# Patient Record
Sex: Female | Born: 1960 | Race: White | Hispanic: No
Health system: Southern US, Community
[De-identification: ages and names within clinical notes are randomized; demographics above are authoritative.]

---

## 1998-01-30 ENCOUNTER — Ambulatory Visit (HOSPITAL_COMMUNITY): Admission: RE | Admit: 1998-01-30 | Discharge: 1998-01-30 | Payer: Self-pay | Admitting: Obstetrics and Gynecology

## 1999-07-11 ENCOUNTER — Observation Stay (HOSPITAL_COMMUNITY): Admission: AD | Admit: 1999-07-11 | Discharge: 1999-07-12 | Payer: Self-pay | Admitting: *Deleted

## 2000-04-11 ENCOUNTER — Other Ambulatory Visit: Admission: RE | Admit: 2000-04-11 | Discharge: 2000-04-11 | Payer: Self-pay | Admitting: Obstetrics and Gynecology

## 2000-05-03 ENCOUNTER — Inpatient Hospital Stay (HOSPITAL_COMMUNITY): Admission: AD | Admit: 2000-05-03 | Discharge: 2000-05-03 | Payer: Self-pay | Admitting: Obstetrics and Gynecology

## 2001-03-01 ENCOUNTER — Ambulatory Visit (HOSPITAL_COMMUNITY): Admission: RE | Admit: 2001-03-01 | Discharge: 2001-03-01 | Payer: Self-pay | Admitting: Obstetrics and Gynecology

## 2001-03-20 ENCOUNTER — Other Ambulatory Visit: Admission: RE | Admit: 2001-03-20 | Discharge: 2001-03-20 | Payer: Self-pay | Admitting: Obstetrics and Gynecology

## 2002-05-14 ENCOUNTER — Other Ambulatory Visit: Admission: RE | Admit: 2002-05-14 | Discharge: 2002-05-14 | Payer: Self-pay | Admitting: Obstetrics and Gynecology

## 2003-07-17 ENCOUNTER — Other Ambulatory Visit: Admission: RE | Admit: 2003-07-17 | Discharge: 2003-07-17 | Payer: Self-pay | Admitting: Obstetrics and Gynecology

## 2004-08-10 ENCOUNTER — Ambulatory Visit: Payer: Self-pay | Admitting: Internal Medicine

## 2004-08-28 ENCOUNTER — Other Ambulatory Visit: Admission: RE | Admit: 2004-08-28 | Discharge: 2004-08-28 | Payer: Self-pay | Admitting: Obstetrics and Gynecology

## 2005-10-11 ENCOUNTER — Other Ambulatory Visit: Admission: RE | Admit: 2005-10-11 | Discharge: 2005-10-11 | Payer: Self-pay | Admitting: Obstetrics and Gynecology

## 2010-12-15 ENCOUNTER — Other Ambulatory Visit: Payer: Self-pay | Admitting: Obstetrics and Gynecology

## 2010-12-15 DIAGNOSIS — R928 Other abnormal and inconclusive findings on diagnostic imaging of breast: Secondary | ICD-10-CM

## 2010-12-18 ENCOUNTER — Ambulatory Visit
Admission: RE | Admit: 2010-12-18 | Discharge: 2010-12-18 | Disposition: A | Discharge status: Home / Self Care | Payer: BC Managed Care – PPO | Source: Ambulatory Visit | Attending: Obstetrics and Gynecology | Admitting: Obstetrics and Gynecology

## 2010-12-18 DIAGNOSIS — R928 Other abnormal and inconclusive findings on diagnostic imaging of breast: Secondary | ICD-10-CM

## 2016-10-14 ENCOUNTER — Other Ambulatory Visit: Payer: Self-pay | Admitting: Otolaryngology

## 2016-10-14 DIAGNOSIS — H9120 Sudden idiopathic hearing loss, unspecified ear: Secondary | ICD-10-CM

## 2016-10-20 ENCOUNTER — Ambulatory Visit
Admission: RE | Admit: 2016-10-20 | Discharge: 2016-10-20 | Disposition: A | Discharge status: Home / Self Care | Payer: BC Managed Care – PPO | Source: Ambulatory Visit | Attending: Otolaryngology | Admitting: Otolaryngology

## 2016-10-20 DIAGNOSIS — H9120 Sudden idiopathic hearing loss, unspecified ear: Secondary | ICD-10-CM

## 2016-10-25 ENCOUNTER — Other Ambulatory Visit: Payer: Self-pay | Admitting: Otolaryngology

## 2016-10-25 DIAGNOSIS — H9120 Sudden idiopathic hearing loss, unspecified ear: Secondary | ICD-10-CM

## 2016-10-26 ENCOUNTER — Ambulatory Visit
Admission: RE | Admit: 2016-10-26 | Discharge: 2016-10-26 | Disposition: A | Discharge status: Home / Self Care | Payer: BC Managed Care – PPO | Source: Ambulatory Visit | Attending: Otolaryngology | Admitting: Otolaryngology

## 2016-10-26 DIAGNOSIS — H9120 Sudden idiopathic hearing loss, unspecified ear: Secondary | ICD-10-CM

## 2016-10-26 MED ORDER — IOPAMIDOL (ISOVUE-300) INJECTION 61%
75.0000 mL | Freq: Once | INTRAVENOUS | Status: AC | PRN
  Administered 2016-10-26: 75 mL via INTRAVENOUS

## 2017-06-17 ENCOUNTER — Other Ambulatory Visit: Payer: Self-pay | Admitting: Otolaryngology

## 2017-06-17 DIAGNOSIS — H9042 Sensorineural hearing loss, unilateral, left ear, with unrestricted hearing on the contralateral side: Secondary | ICD-10-CM

## 2017-06-17 DIAGNOSIS — IMO0001 Reserved for inherently not codable concepts without codable children: Secondary | ICD-10-CM

## 2017-06-26 ENCOUNTER — Ambulatory Visit
Admission: RE | Admit: 2017-06-26 | Discharge: 2017-06-26 | Disposition: A | Discharge status: Home / Self Care | Payer: BC Managed Care – PPO | Source: Ambulatory Visit | Attending: Otolaryngology | Admitting: Otolaryngology

## 2017-06-26 DIAGNOSIS — H9042 Sensorineural hearing loss, unilateral, left ear, with unrestricted hearing on the contralateral side: Secondary | ICD-10-CM

## 2017-06-26 DIAGNOSIS — IMO0001 Reserved for inherently not codable concepts without codable children: Secondary | ICD-10-CM

## 2018-01-10 ENCOUNTER — Other Ambulatory Visit: Payer: Self-pay | Admitting: Otolaryngology

## 2018-01-10 DIAGNOSIS — H8302 Labyrinthitis, left ear: Secondary | ICD-10-CM

## 2018-01-10 DIAGNOSIS — IMO0001 Reserved for inherently not codable concepts without codable children: Secondary | ICD-10-CM

## 2018-01-10 DIAGNOSIS — H9042 Sensorineural hearing loss, unilateral, left ear, with unrestricted hearing on the contralateral side: Secondary | ICD-10-CM

## 2018-01-17 ENCOUNTER — Ambulatory Visit
Admission: RE | Admit: 2018-01-17 | Discharge: 2018-01-17 | Disposition: A | Discharge status: Home / Self Care | Payer: BC Managed Care – PPO | Source: Ambulatory Visit | Attending: Otolaryngology | Admitting: Otolaryngology

## 2018-01-17 DIAGNOSIS — IMO0001 Reserved for inherently not codable concepts without codable children: Secondary | ICD-10-CM

## 2018-01-17 DIAGNOSIS — H9042 Sensorineural hearing loss, unilateral, left ear, with unrestricted hearing on the contralateral side: Secondary | ICD-10-CM

## 2018-01-17 DIAGNOSIS — H8302 Labyrinthitis, left ear: Secondary | ICD-10-CM

## 2018-01-17 MED ORDER — GADOBENATE DIMEGLUMINE 529 MG/ML IV SOLN
15.0000 mL | Freq: Once | INTRAVENOUS | Status: AC | PRN
Start: 2018-01-17 — End: 2018-01-17
  Administered 2018-01-17: 15 mL via INTRAVENOUS

## 2018-01-23 ENCOUNTER — Other Ambulatory Visit: Payer: BC Managed Care – PPO

## 2018-03-13 ENCOUNTER — Ambulatory Visit
Admission: RE | Admit: 2018-03-13 | Discharge: 2018-03-13 | Disposition: A | Discharge status: Home / Self Care | Payer: BC Managed Care – PPO | Source: Ambulatory Visit | Attending: Sports Medicine | Admitting: Sports Medicine

## 2018-03-13 ENCOUNTER — Ambulatory Visit: Payer: BC Managed Care – PPO | Admitting: Sports Medicine

## 2018-03-13 VITALS — BP 134/70 | Ht 62.0 in | Wt 160.0 lb

## 2018-03-13 DIAGNOSIS — M79671 Pain in right foot: Secondary | ICD-10-CM

## 2018-03-14 NOTE — Progress Notes (Signed)
   Subjective:    Patient ID: Jodi Hall, female    DOB: 04/21/1961, 57 y.o.   MRN: [id number]  HPI chief complaint: Right foot pain  57 year old female comes in today complaining of 3-4 weeks of right foot pain. No injury that she can recall but a gradual onset of pain that is primarily on the dorsum of her foot near the second and third metatarsals. Pain is worse with walking, especially with bending her toes. She is starting to walk with a slight limp. She had a similar episode several years ago which resolved spontaneously without any specific treatment. That pain started after she attended a boot camp but she cannot recall any specific injury or increase in activity leading up to her current pain. She denies numbness or tingling in her toes. She denies pain on the bottom of her foot. No pain at the heel. Pain is described as achy and is worse at night.  Past medical history reviewed Medications reviewed Allergies reviewed    Review of Systems As above    Objective:   Physical Exam  Well-developed, well-nourished. No acute distress. Awake alert and oriented 3. Vital signs reviewed  Right foot: There may be some minimal swelling across the dorsum of the foot. She is tender to palpation at the distal third metatarsal. Mild pain with metatarsal squeeze. She has no tenderness to palpation over the metatarsal heads.No palpable neuroma. Negative mudler sign. Good pulses. Neurovascularly intact distally. Walking with a slight limp.  Bedside ultrasound of the right foot shows a mild amount of edema along the distal third metatarsal shaft but no cortical irregularity and no increase in neovascularity  X-ray of the right foot including AP, lateral, and oblique views are unremarkable. Specifically, no evidence of stress fracture      Assessment & Plan:   Right foot pain likely secondary to third metatarsal stress reaction  Although the patient does not recall any specific increase in  activity leading up to her pain her physical exam is suggestive of a distal third metatarsal stress reaction. I do not see a stress fracture on ultrasound nor on x-ray. I'm going to treat her with a postop shoe for the next 2-3 weeks. She may wean from this as her symptoms allow thereafter. If symptoms persist then consider merits of further diagnostic imaging. Follow-up for ongoing or recalcitrant issues.

## 2018-05-22 ENCOUNTER — Ambulatory Visit (INDEPENDENT_AMBULATORY_CARE_PROVIDER_SITE_OTHER): Payer: BC Managed Care – PPO | Admitting: Sports Medicine

## 2018-05-22 ENCOUNTER — Encounter: Payer: Self-pay | Admitting: Sports Medicine

## 2018-05-22 VITALS — BP 112/74 | Ht 62.0 in | Wt 160.0 lb

## 2018-05-22 DIAGNOSIS — M79671 Pain in right foot: Secondary | ICD-10-CM | POA: Diagnosis not present

## 2018-05-22 MED ORDER — MELOXICAM 15 MG PO TABS: ORAL_TABLET | ORAL | 0 refills | Status: DC

## 2018-05-22 NOTE — Progress Notes (Signed)
   Subjective:    Patient ID: Jodi Hall, female    DOB: 1961/07/25, 57 y.o.   MRN: [id number]  HPI 56yoF presents for evaluation of right foot pain. Pain located along medial and dorsal aspect of first metatarsal shaft of right foot. Onset was 3-4wks ago. No hx of trauma or injury to site. Denies any skin changes: swelling, bruising, or erythema.  Patient notices pain mostly after exercise. In terms of exercise, she is participating in body combat classes: punching and kicking exercises. She denies pain with walking; however, does have pain after exercise, prolonged standing, and at bedtime.  She describes pain as "tooth ache" that is 7/10 at it's worst.  She is not taking any medications for pain. She is not icing foot either. Pain resolves with time. She denies any sensation changes, inability to walk, or loss of ROM in foot.   Of note, in June 2019 patient had 3rd metatarsal stress reaction that resolved with 2-3 weeks of using post-op shoe.    Review of Systems As per above.  I have reviewed patient's pmh, medications, and previous imaging.     Objective:   Physical Exam G: patient is alert, pleasant Pulm: no conversational dyspnea MSK:  Right foot:  No tenderness.  ROM is intact. + reproducible pain with foot eversion. 5/5 strength with extension and flexion of big toe. 5/5 strength of all muscle groups in foot and ankle.  No sensory changes. +2 Dorsalis Pedis pulses. No hallux rigidus. Left foot: normal inspection w/o tenderness. Normal sensation. ROM intact. +2 dorsalis pedis pulses Neuro: No focal deficients. Gait: Slight pronation bilaterally. Pain elicited with heel walk.   X ray of right foot (June 2019): no signs of stress fracture or OA         Assessment & Plan:  Foot pain most likely due to pronation  Less concerned for stress fracture from history and examination. We discussed inserts, arch strap, and NSAIDs.  Patient and I agree to trial of Meloxicam for 6-7 days,  followed by PRN use. We discussed importance of taking Meloxicam with food. Patient voiced understanding.  Green inserts and arch strap were given to pt during office visit.  Plan to follow up in 2 weeks if sxs have not improved. If sxs have not improved in 2 weeks, consider further work up with MRI.   Patient is to call and schedule appointment in two weeks if sxs have not improved. We also discussed the possibility of custom orthotics at a later date if she finds the green inserts to be helpful.

## 2018-06-26 ENCOUNTER — Other Ambulatory Visit: Payer: Self-pay | Admitting: *Deleted

## 2018-06-26 MED ORDER — MELOXICAM 15 MG PO TABS: ORAL_TABLET | ORAL | 0 refills | Status: DC

## 2018-08-03 ENCOUNTER — Other Ambulatory Visit: Payer: Self-pay

## 2018-08-03 MED ORDER — MELOXICAM 15 MG PO TABS: 15.0000 mg | ORAL_TABLET | Freq: Every day | ORAL | 0 refills | Status: DC | PRN

## 2018-09-07 ENCOUNTER — Other Ambulatory Visit: Payer: Self-pay

## 2018-09-07 MED ORDER — MELOXICAM 15 MG PO TABS: 15.0000 mg | ORAL_TABLET | Freq: Every day | ORAL | 0 refills | Status: DC | PRN

## 2018-12-17 ENCOUNTER — Other Ambulatory Visit: Payer: Self-pay | Admitting: Sports Medicine

## 2019-01-05 ENCOUNTER — Other Ambulatory Visit: Payer: Self-pay | Admitting: Otolaryngology

## 2019-01-05 DIAGNOSIS — H8302 Labyrinthitis, left ear: Secondary | ICD-10-CM

## 2019-01-05 DIAGNOSIS — H6062 Unspecified chronic otitis externa, left ear: Secondary | ICD-10-CM

## 2019-01-12 ENCOUNTER — Other Ambulatory Visit: Payer: BC Managed Care – PPO

## 2019-01-12 ENCOUNTER — Ambulatory Visit
Admission: RE | Admit: 2019-01-12 | Discharge: 2019-01-12 | Disposition: A | Discharge status: Home / Self Care | Payer: BC Managed Care – PPO | Source: Ambulatory Visit | Attending: Otolaryngology | Admitting: Otolaryngology

## 2019-01-12 ENCOUNTER — Other Ambulatory Visit: Payer: Self-pay

## 2019-01-12 DIAGNOSIS — H8302 Labyrinthitis, left ear: Secondary | ICD-10-CM

## 2019-01-12 DIAGNOSIS — H6062 Unspecified chronic otitis externa, left ear: Secondary | ICD-10-CM

## 2019-01-12 MED ORDER — GADOBENATE DIMEGLUMINE 529 MG/ML IV SOLN
15.0000 mL | Freq: Once | INTRAVENOUS | Status: AC | PRN
  Administered 2019-01-12: 15 mL via INTRAVENOUS

## 2019-03-11 ENCOUNTER — Other Ambulatory Visit: Payer: Self-pay | Admitting: Sports Medicine

## 2019-04-15 ENCOUNTER — Other Ambulatory Visit: Payer: Self-pay | Admitting: Sports Medicine

## 2019-11-16 ENCOUNTER — Ambulatory Visit: Payer: BC Managed Care – PPO | Attending: Internal Medicine

## 2019-11-16 DIAGNOSIS — Z23 Encounter for immunization: Secondary | ICD-10-CM | POA: Insufficient documentation

## 2019-11-16 NOTE — Progress Notes (Signed)
   Covid-19 Vaccination Clinic  Name:  ELIZBETH POSA    MRN: [id number] DOB: 1961/07/08  11/16/2019  Ms. Medley was observed post Covid-19 immunization for 15 minutes without incidence. She was provided with Vaccine Information Sheet and instruction to access the V-Safe system.   Ms. Bilotta was instructed to call 911 with any severe reactions post vaccine: Marland Kitchen Difficulty breathing  . Swelling of your face and throat  . A fast heartbeat  . A bad rash all over your body  . Dizziness and weakness    Immunizations Administered    Name Date Dose VIS Date Route   Pfizer COVID-19 Vaccine 11/16/2019  4:06 PM 0.3 mL 09/07/2019 Intramuscular   Manufacturer: [employer], [employer]   Lot: [id number]   NDC: [address]-2

## 2019-12-11 ENCOUNTER — Ambulatory Visit: Payer: BC Managed Care – PPO | Attending: Internal Medicine

## 2019-12-11 DIAGNOSIS — Z23 Encounter for immunization: Secondary | ICD-10-CM

## 2019-12-11 NOTE — Progress Notes (Signed)
   Covid-19 Vaccination Clinic  Name:  LINDEE LEASON    MRN: [id number] DOB: Oct 08, 1960  12/11/2019  Ms. Giebel was observed post Covid-19 immunization for 15 minutes without incident. She was provided with Vaccine Information Sheet and instruction to access the V-Safe system.   Ms. Busker was instructed to call 911 with any severe reactions post vaccine: Marland Kitchen Difficulty breathing  . Swelling of face and throat  . A fast heartbeat  . A bad rash all over body  . Dizziness and weakness   Immunizations Administered    Name Date Dose VIS Date Route   Pfizer COVID-19 Vaccine 12/11/2019  2:22 PM 0.3 mL 09/07/2019 Intramuscular   Manufacturer: [employer], [employer]   Lot: [id number]   NDC: [address]-0

## 2020-05-15 ENCOUNTER — Other Ambulatory Visit: Payer: Self-pay | Admitting: Obstetrics and Gynecology

## 2020-05-15 DIAGNOSIS — Z1231 Encounter for screening mammogram for malignant neoplasm of breast: Secondary | ICD-10-CM

## 2020-05-20 ENCOUNTER — Other Ambulatory Visit: Payer: BC Managed Care – PPO

## 2020-06-03 ENCOUNTER — Ambulatory Visit
Admission: RE | Admit: 2020-06-03 | Discharge: 2020-06-03 | Disposition: A | Discharge status: Home / Self Care | Payer: BC Managed Care – PPO | Source: Ambulatory Visit | Attending: Obstetrics and Gynecology | Admitting: Obstetrics and Gynecology

## 2020-06-03 ENCOUNTER — Other Ambulatory Visit: Payer: Self-pay

## 2020-06-03 DIAGNOSIS — Z1231 Encounter for screening mammogram for malignant neoplasm of breast: Secondary | ICD-10-CM

## 2020-08-14 ENCOUNTER — Encounter: Payer: Self-pay | Admitting: Sports Medicine

## 2020-08-14 ENCOUNTER — Other Ambulatory Visit: Payer: Self-pay

## 2020-08-14 ENCOUNTER — Ambulatory Visit (INDEPENDENT_AMBULATORY_CARE_PROVIDER_SITE_OTHER): Payer: BC Managed Care – PPO | Admitting: Sports Medicine

## 2020-08-14 VITALS — BP 118/80 | Ht 62.0 in | Wt 160.0 lb

## 2020-08-14 DIAGNOSIS — M79672 Pain in left foot: Secondary | ICD-10-CM

## 2020-08-14 NOTE — Progress Notes (Addendum)
SUBJECTIVE:   CHIEF COMPLAINT / HPI:   Left foot pain: Patient is a 59 year old female presenting today for left foot pain that started about 3 weeks ago while she was walking.  Patient states that for many months she has been walking 3 miles per day which is typically done 4-5 times per week.  On 1 of these bouts that she noticed a pain in her foot start to increase throughout the days workout and by the end she noticed that she was limping.  She states this occurred about 3 weeks ago and has not really resolved since then.  She denies any specific trauma to the foot and had not had a recent increase in her walking duration or speed.  She states that in the past she had an issue that was similar to this with her right foot.  Patient states that she had been planning to purchase some new shoes as her current shoes are quite worn.  OBJECTIVE:   BP 118/80   Ht 5\' 2"  (1.575 m)   Wt 160 lb (72.6 kg)   BMI 29.26 kg/m    Foot exam, left: No swelling or erythema.  No discomfort to palpation of patellar tendon, calcaneus, plantar aspect of the foot.  Patient does have point tenderness to the proximal fourth metatarsal.  Patient has normal strength in all directions with the ankle.  Patient does not experience any pain radiating to the toes.  Patient has good peripheral pulses.  Limited ultrasound performed in clinic which showed an increase in blood flow over the proximal fourth metatarsal compared to the third metatarsal which is consistent with a stress reaction.  No stress fractures were observed on the ultrasound.  ASSESSMENT/PLAN:   Left foot pain: Assessment: 59 year old female with 3 weeks of left foot pain which seems to be isolated to the dorsal aspect of the foot in the region of the proximal fourth metatarsal.  Patient denies any specific injury.  Physical exam consistent with a stress injury with point tenderness to the proximal fourth metatarsal.  Ultrasound performed did show an  increase in blood flow to the region of the fourth metatarsal no obvious stress fracture which is consistent with a stress reaction. Plan: -Discussed with patient to continue to minimize her walking and wear the postop shoe for support until she is no longer limping -Plan for 3-week follow-up at which point we will analyze the patient's gait -Patient plans to purchase new shoes as her previous shoes are very worn and this may be contributing to her stress reaction, she plans to purchase new shoes prior to her next appointment so we can analyze her gait appropriately   59, DO [facility]    This note was prepared using Dragon voice recognition software and may include unintentional dictation errors due to the inherent limitations of voice recognition software.  Patient seen and evaluated with the resident.  I agree with the above plan of care.  Ultrasound today suggests a stress reaction at the fourth metatarsal.  Patient will wear a postop shoe until she is able to ambulate without pain.  She may then transition into a comfortable walking shoe.  No recreational walking until follow-up with me in 3 weeks.  At that time, she will bring in a new pair of walking shoes (her current Hokas are quite worn down, especially at the forefoot).  We will plan on doing a gait analysis to see whether or not she would  benefit from any sort of orthotic.

## 2020-08-14 NOTE — Patient Instructions (Signed)
It was great to see you!  Our plans for today:  -We performed an ultrasound which did show evidence of a stress reaction in your left foot.  We would like for you to follow-up in 3 weeks at which point we will analyze your gait. -Continue to minimize your walking and use the postop shoe until you are no longer limping -Go ahead and purchase your new shoes as you were planning before your next appointment and we will analyze your gait at that time

## 2020-08-19 ENCOUNTER — Ambulatory Visit: Payer: BC Managed Care – PPO | Admitting: Sports Medicine

## 2020-09-04 ENCOUNTER — Other Ambulatory Visit: Payer: Self-pay

## 2020-09-04 ENCOUNTER — Ambulatory Visit (INDEPENDENT_AMBULATORY_CARE_PROVIDER_SITE_OTHER): Payer: BC Managed Care – PPO | Admitting: Sports Medicine

## 2020-09-04 VITALS — BP 122/82 | Ht 62.0 in | Wt 160.0 lb

## 2020-09-04 DIAGNOSIS — M79672 Pain in left foot: Secondary | ICD-10-CM | POA: Diagnosis not present

## 2020-09-04 NOTE — Progress Notes (Signed)
   Subjective:    Patient ID: Isabelle Course, female    DOB: 04/10/1961, 59 y.o.   MRN: [id number]  HPI   Patient comes in today for follow-up on a suspected stress reaction in the fourth metatarsal of the left foot.  Overall she is improving.  She still has some occasional pain so she is still in her cam walker.  She has purchased new running shoes.   Review of Systems As above    Objective:   Physical Exam  Well-developed, well-nourished.  No acute distress  Left foot: Full ankle range of motion.  There is no swelling on the dorsum of the foot.  There is still some slight tenderness to palpation over the dorsum of the foot at the fourth metatarsal.  Still pain with metatarsal squeeze.  Good pulses.  Evaluation of her gait shows a neutral foot strike and no limp.      Assessment & Plan:   Clinically healing fourth metatarsal stress reaction, left foot  Patient may wean from her cam walker into a good supportive shoe over the next 3 weeks.  I want her to refrain from recreational walking until she is pain-free with activities of daily living.  She will then resume her walking at 50% of her normal weekly volume and increase 10 %/week.  I do think her new walking shoes (which have really good cushioning) will help.  With her neutral gait, I do not think she needs any sort of orthotic.  She will follow-up for ongoing or recalcitrant issues.

## 2021-03-23 ENCOUNTER — Other Ambulatory Visit: Payer: Self-pay | Admitting: Obstetrics and Gynecology

## 2021-03-23 DIAGNOSIS — Z1231 Encounter for screening mammogram for malignant neoplasm of breast: Secondary | ICD-10-CM

## 2021-06-12 ENCOUNTER — Ambulatory Visit
Admission: RE | Admit: 2021-06-12 | Discharge: 2021-06-12 | Disposition: A | Discharge status: Home / Self Care | Payer: BC Managed Care – PPO | Source: Ambulatory Visit | Attending: Obstetrics and Gynecology | Admitting: Obstetrics and Gynecology

## 2021-06-12 ENCOUNTER — Other Ambulatory Visit: Payer: Self-pay

## 2021-06-12 DIAGNOSIS — Z1231 Encounter for screening mammogram for malignant neoplasm of breast: Secondary | ICD-10-CM

## 2021-06-19 ENCOUNTER — Other Ambulatory Visit: Payer: Self-pay | Admitting: Obstetrics and Gynecology

## 2021-06-19 DIAGNOSIS — R928 Other abnormal and inconclusive findings on diagnostic imaging of breast: Secondary | ICD-10-CM

## 2021-07-06 ENCOUNTER — Ambulatory Visit
Admission: RE | Admit: 2021-07-06 | Discharge: 2021-07-06 | Disposition: A | Discharge status: Home / Self Care | Payer: BC Managed Care – PPO | Source: Ambulatory Visit | Attending: Obstetrics and Gynecology | Admitting: Obstetrics and Gynecology

## 2021-07-06 ENCOUNTER — Other Ambulatory Visit: Payer: Self-pay

## 2021-07-06 DIAGNOSIS — R928 Other abnormal and inconclusive findings on diagnostic imaging of breast: Secondary | ICD-10-CM

## 2022-04-19 ENCOUNTER — Other Ambulatory Visit: Payer: Self-pay | Admitting: Obstetrics and Gynecology

## 2022-04-19 DIAGNOSIS — Z1231 Encounter for screening mammogram for malignant neoplasm of breast: Secondary | ICD-10-CM

## 2022-04-23 ENCOUNTER — Other Ambulatory Visit: Payer: Self-pay | Admitting: Obstetrics and Gynecology

## 2022-04-23 DIAGNOSIS — M8580 Other specified disorders of bone density and structure, unspecified site: Secondary | ICD-10-CM

## 2022-06-01 ENCOUNTER — Other Ambulatory Visit: Payer: BC Managed Care – PPO

## 2022-06-03 ENCOUNTER — Ambulatory Visit
Admission: RE | Admit: 2022-06-03 | Discharge: 2022-06-03 | Disposition: A | Discharge status: Home / Self Care | Payer: BC Managed Care – PPO | Source: Ambulatory Visit | Attending: Obstetrics and Gynecology | Admitting: Obstetrics and Gynecology

## 2022-06-03 DIAGNOSIS — M8580 Other specified disorders of bone density and structure, unspecified site: Secondary | ICD-10-CM

## 2022-07-06 IMAGING — MG MM DIGITAL DIAGNOSTIC UNILAT*L* W/ TOMO W/ CAD
4 series · 4 of 12 positions shown · non-contrast
Comparison: Previous exam(s).

CLINICAL DATA: 59-year-old female presenting as a recall from
screening for possible left breast masses.

EXAM:
DIGITAL DIAGNOSTIC UNILATERAL LEFT MAMMOGRAM WITH TOMOSYNTHESIS AND
CAD; ULTRASOUND LEFT BREAST LIMITED
TECHNIQUE: Left digital diagnostic mammography and breast tomosynthesis was
performed. The images were evaluated with computer-aided detection.;
Targeted ultrasound examination of the left breast was performed.

[L CC synth-2D]
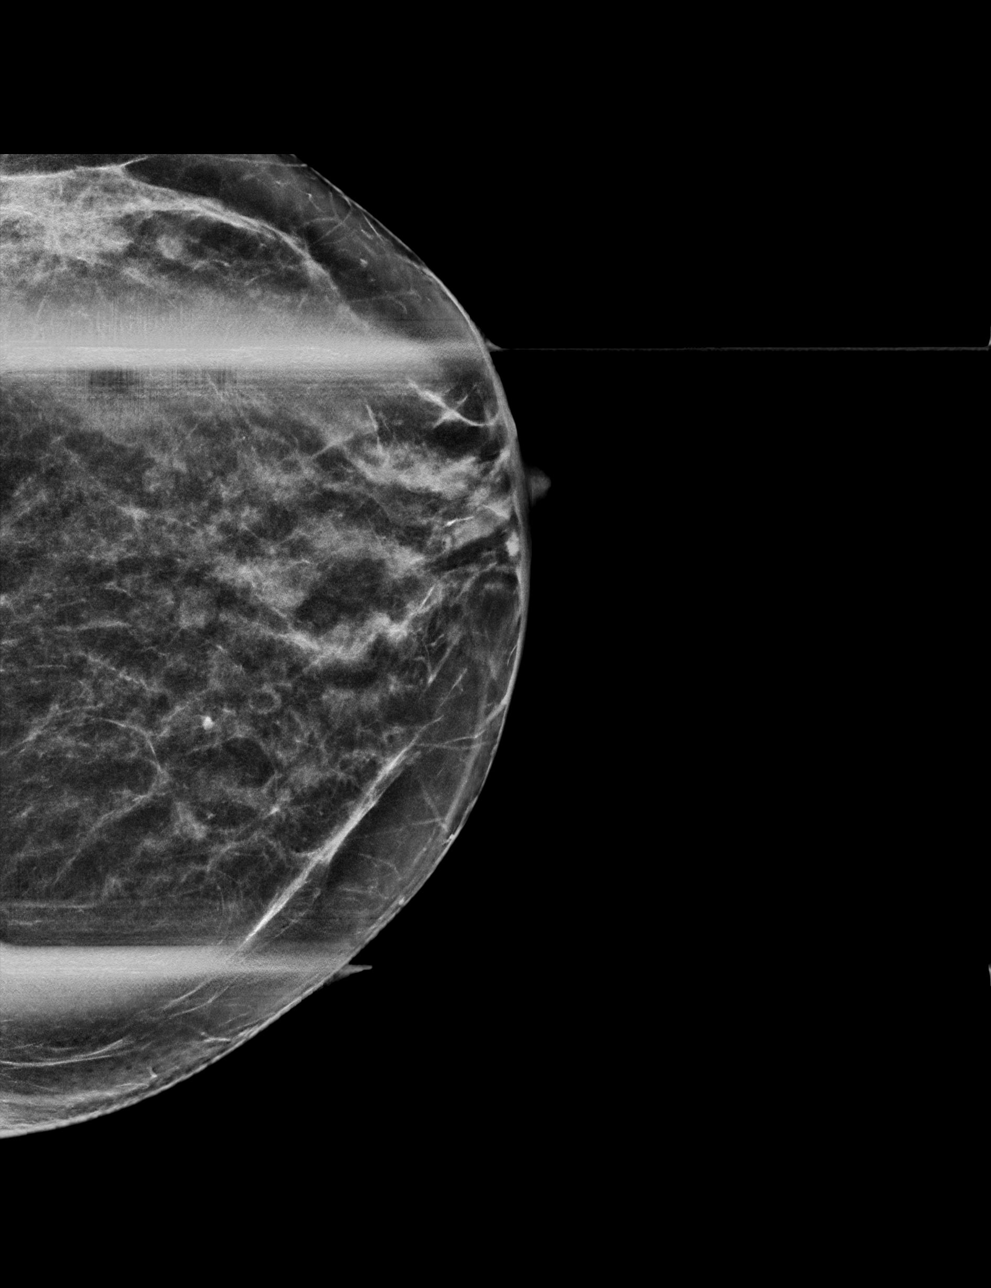

[L MLO synth-2D]
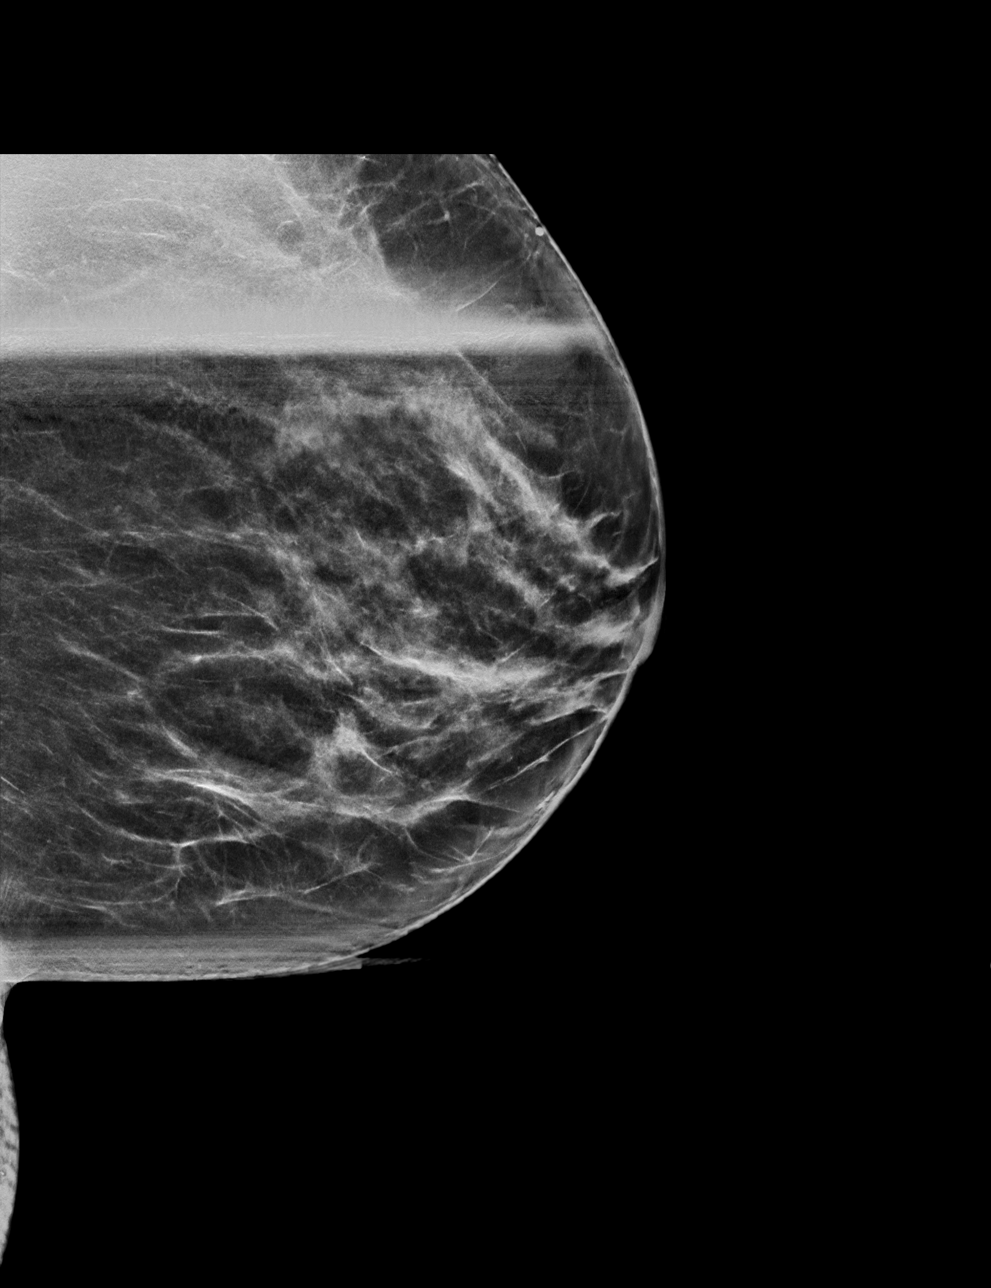

[L MLO tomo · tomo slice 34/67.0]
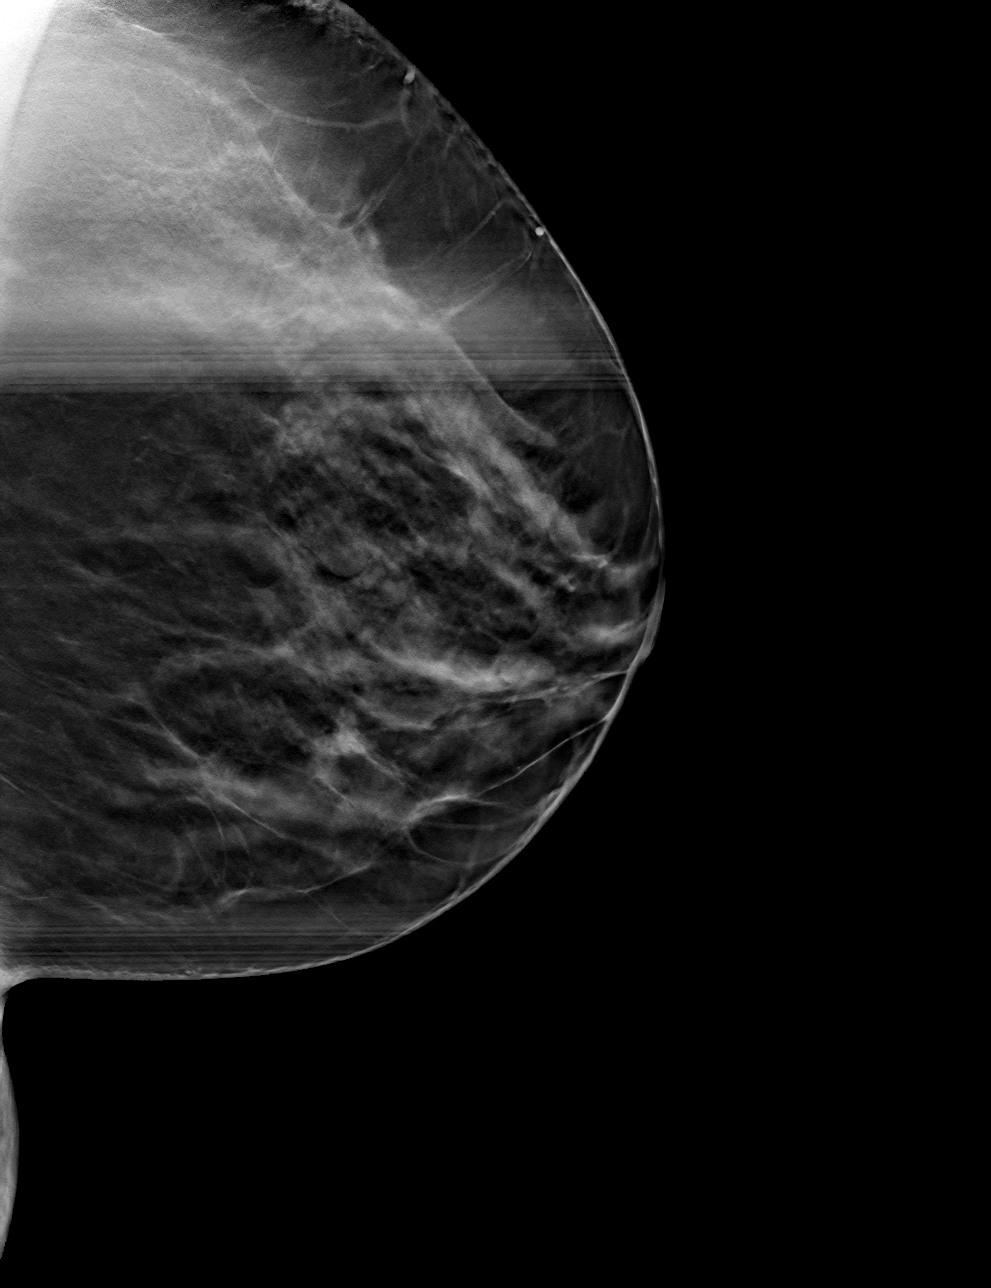

[L CC tomo · tomo slice 29/56.0]
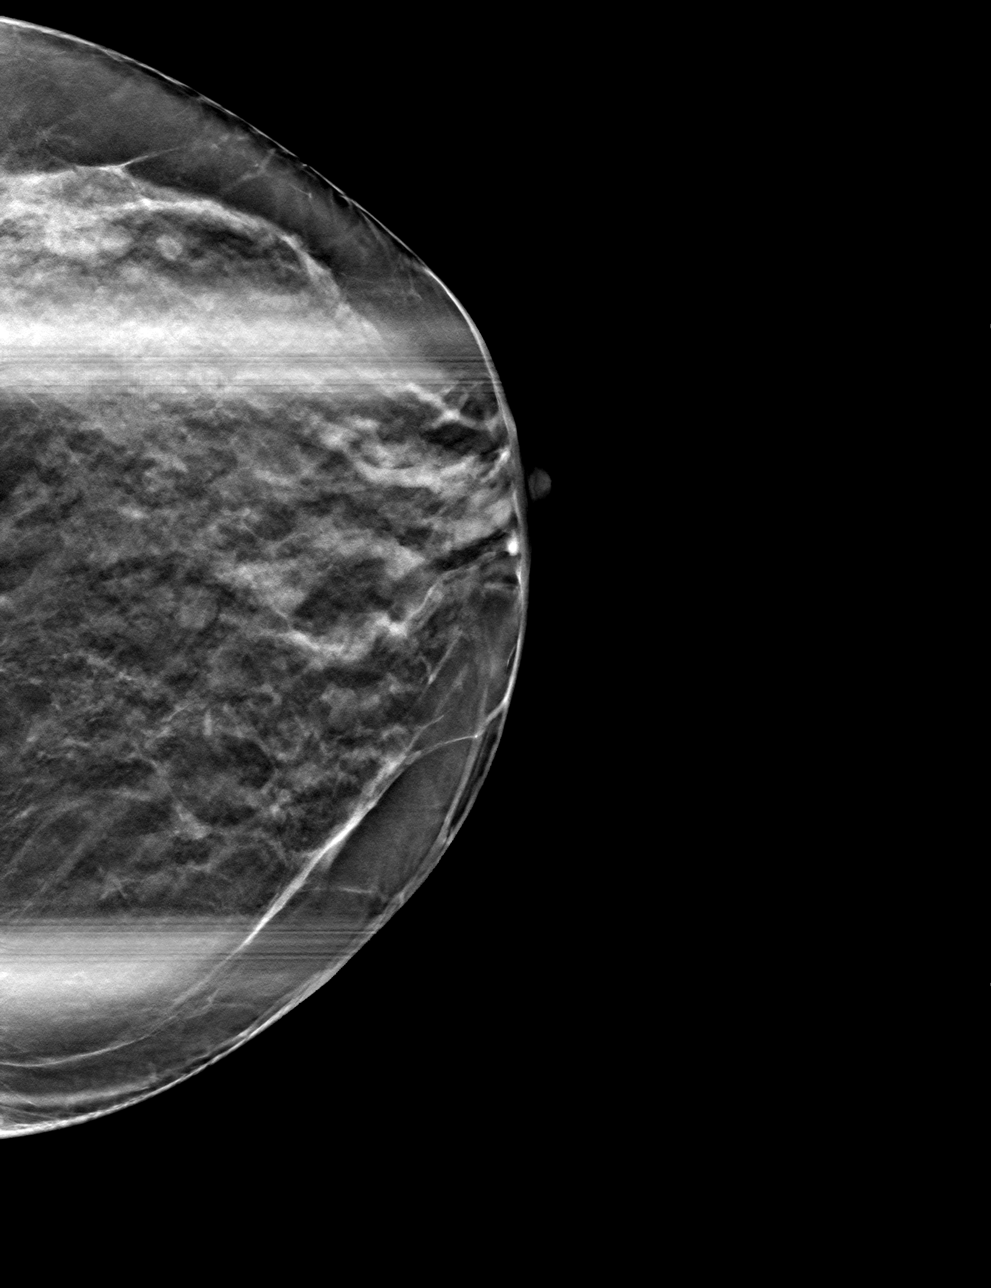

[4 of 12 positions shown; findings below may reference images not displayed]

ACR Breast Density Category c: The breast tissue is heterogeneously
dense, which may obscure small masses.
FINDINGS: Mammogram:

Spot compression tomosynthesis views of the left breast were
performed demonstrating persistence of several small oval
circumscribed masses in the lower inner left breast. These measure
up to 0.8 cm.

Ultrasound:

Targeted ultrasound performed throughout the lower inner quadrant of
the left breast demonstrating multiple oval circumscribed anechoic
masses consistent with benign simple cysts. These correspond to the
mammographic findings. No suspicious solid mass identified.
IMPRESSION: Benign small simple cysts in the lower inner left breast.

RECOMMENDATION:
Screening mammogram in one year.(Code:YF-0-89E)

I have discussed the findings and recommendations with the patient.
If applicable, a reminder letter will be sent to the patient
regarding the next appointment.

BI-RADS CATEGORY  2: Benign.

## 2022-07-06 IMAGING — US US BREAST*L* LIMITED INC AXILLA
1 series · 13 of 14 positions shown · non-contrast
Comparison: Previous exam(s).

CLINICAL DATA: 59-year-old female presenting as a recall from
screening for possible left breast masses.

EXAM:
DIGITAL DIAGNOSTIC UNILATERAL LEFT MAMMOGRAM WITH TOMOSYNTHESIS AND
CAD; ULTRASOUND LEFT BREAST LIMITED
TECHNIQUE: Left digital diagnostic mammography and breast tomosynthesis was
performed. The images were evaluated with computer-aided detection.;
Targeted ultrasound examination of the left breast was performed.

[Series 1: us breast*left* limited inc axilla · 0.06mm/px · 13 of 14 slices shown]
[im 1/14]
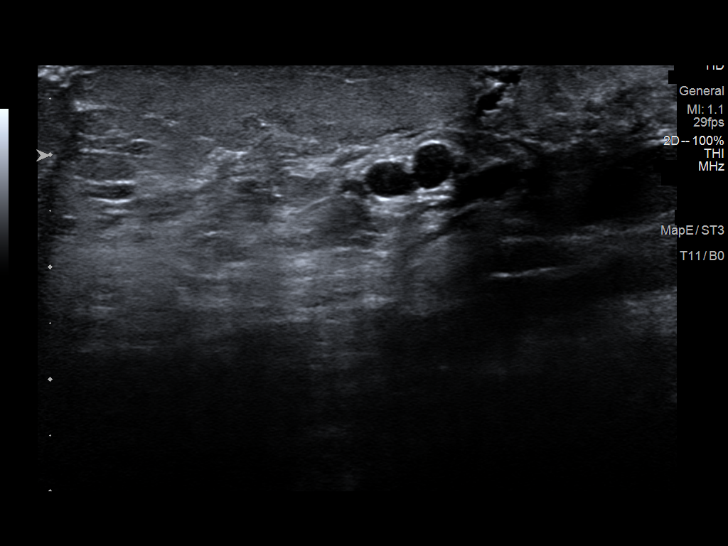
[im 2/14]
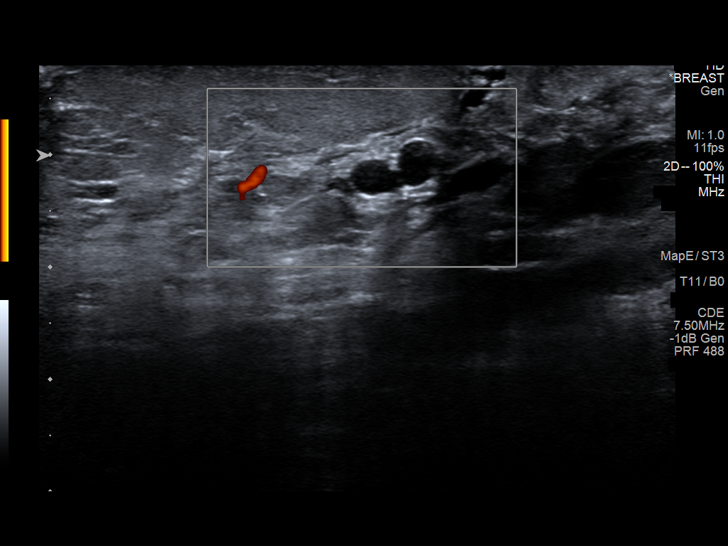
[im 3/14]
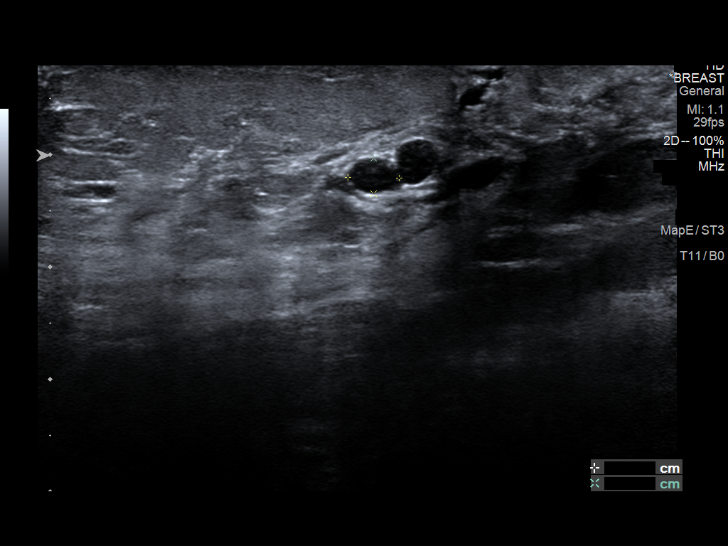
[im 4/14]
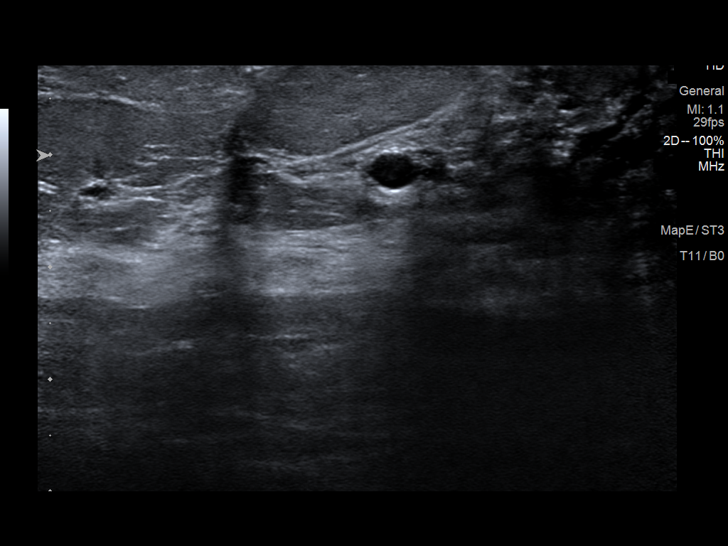
[im 5/14]
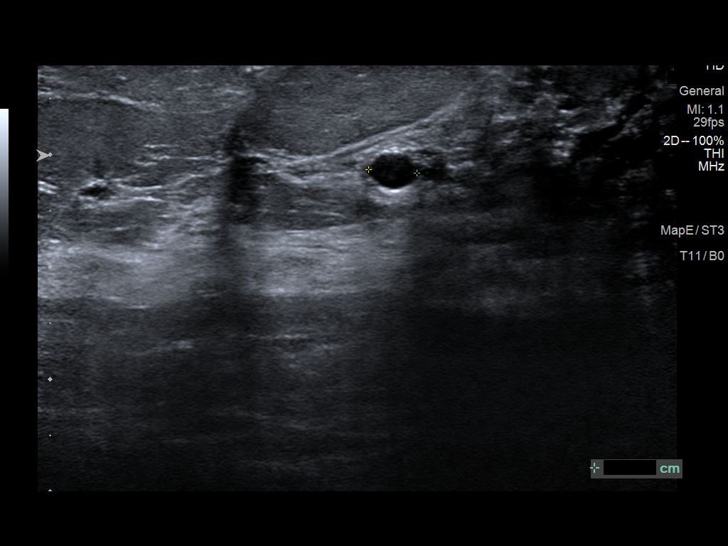
[im 6/14]
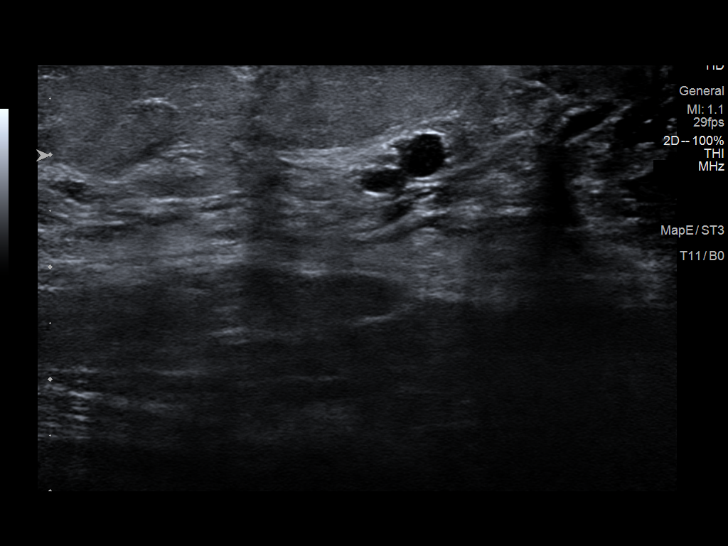
[im 8/14]
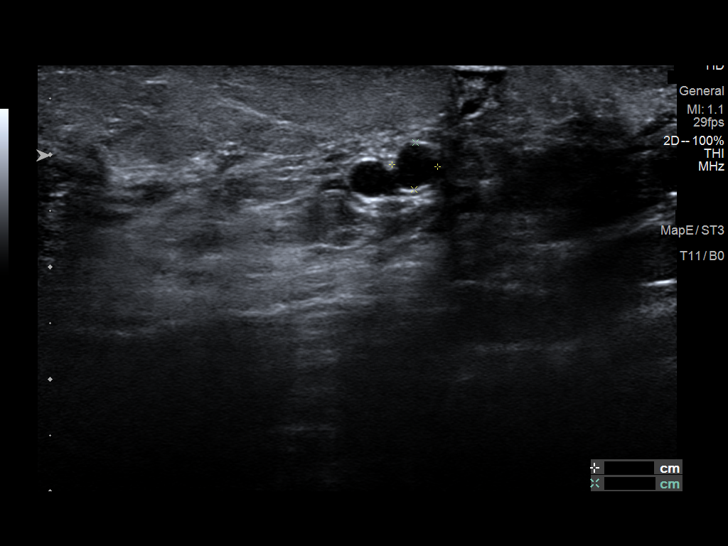
[im 9/14]
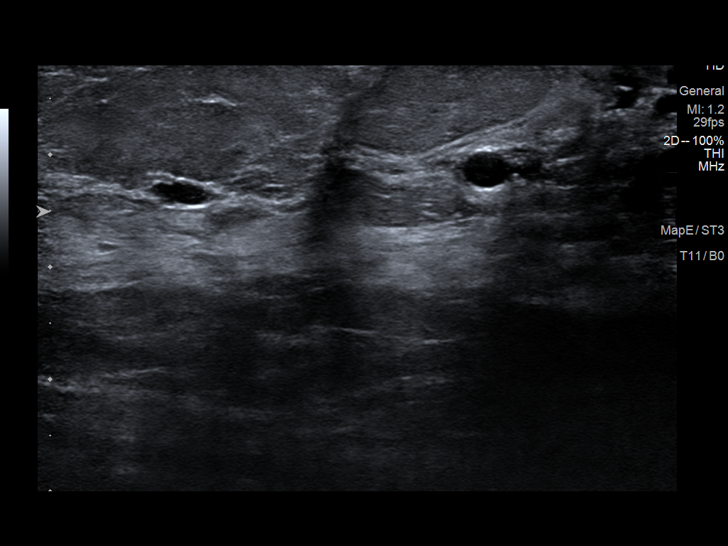
[im 10/14]
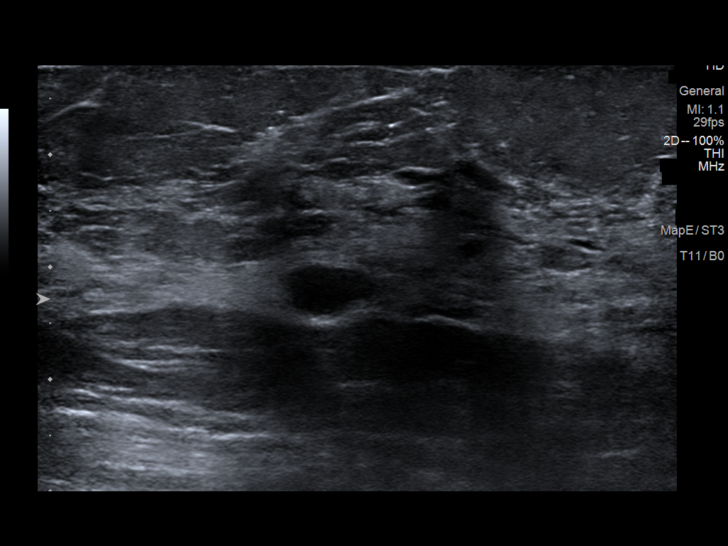
[im 11/14]
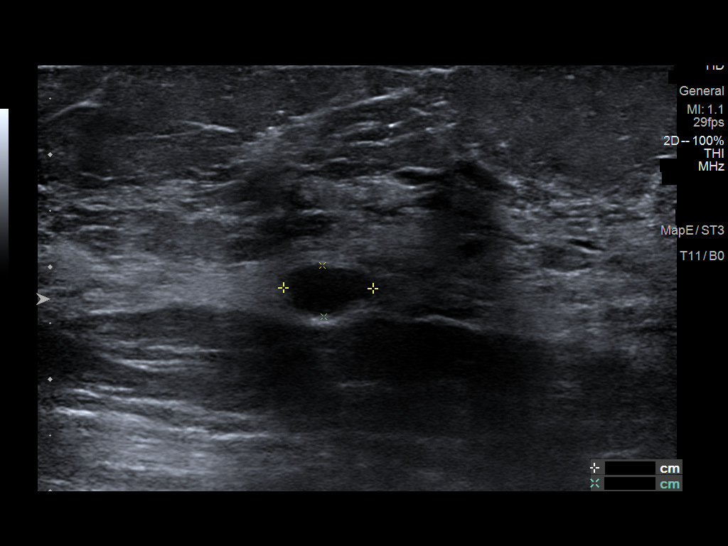
[im 12/14]
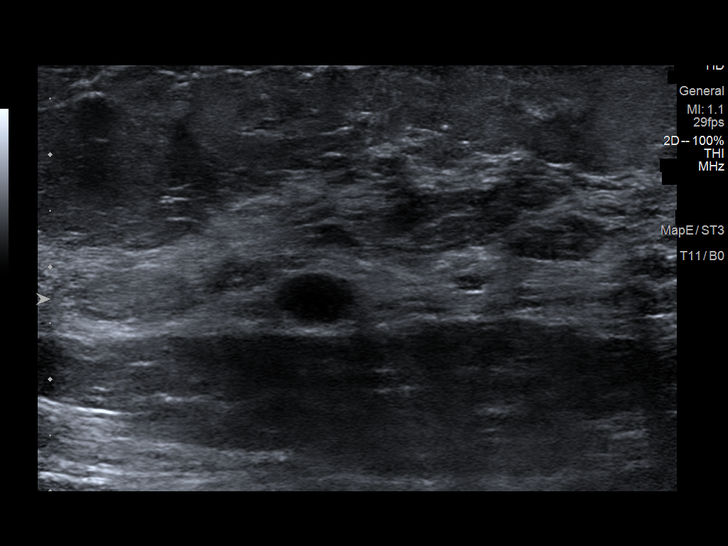
[im 13/14]
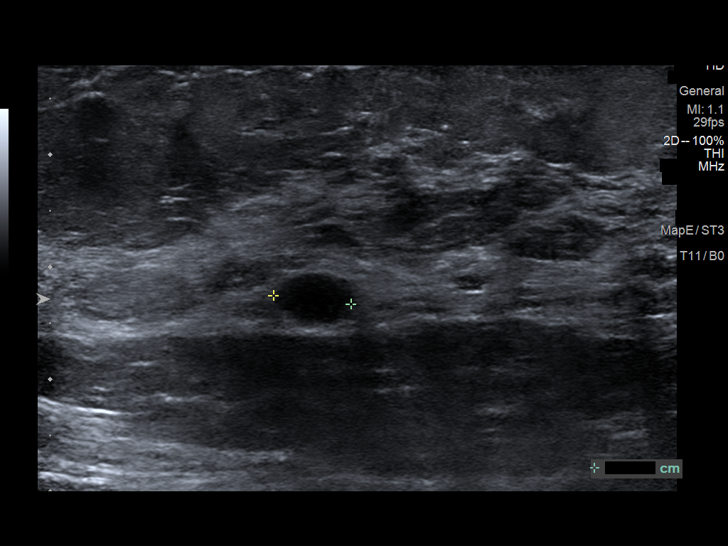
[im 14/14]
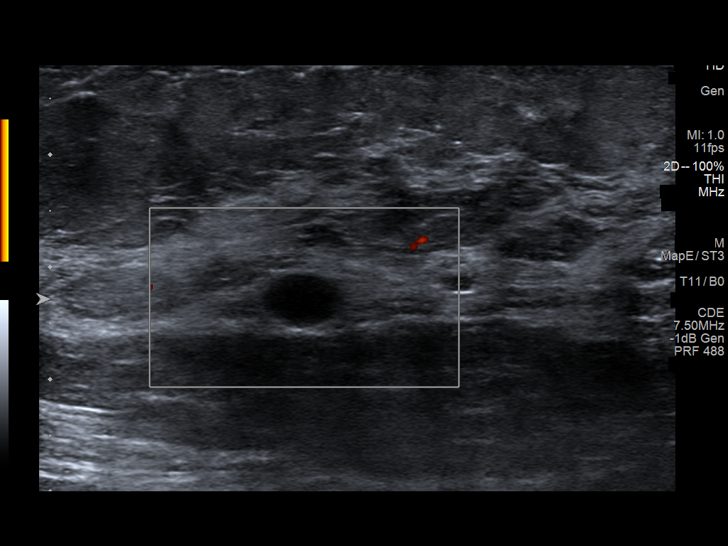

[13 of 14 positions shown; findings below may reference images not displayed]

ACR Breast Density Category c: The breast tissue is heterogeneously
dense, which may obscure small masses.
FINDINGS: Mammogram:

Spot compression tomosynthesis views of the left breast were
performed demonstrating persistence of several small oval
circumscribed masses in the lower inner left breast. These measure
up to 0.8 cm.

Ultrasound:

Targeted ultrasound performed throughout the lower inner quadrant of
the left breast demonstrating multiple oval circumscribed anechoic
masses consistent with benign simple cysts. These correspond to the
mammographic findings. No suspicious solid mass identified.
IMPRESSION: Benign small simple cysts in the lower inner left breast.

RECOMMENDATION:
Screening mammogram in one year.(Code:YF-0-89E)

I have discussed the findings and recommendations with the patient.
If applicable, a reminder letter will be sent to the patient
regarding the next appointment.

BI-RADS CATEGORY  2: Benign.

## 2022-07-09 ENCOUNTER — Ambulatory Visit: Payer: BC Managed Care – PPO

## 2022-08-17 ENCOUNTER — Ambulatory Visit
Admission: RE | Admit: 2022-08-17 | Discharge: 2022-08-17 | Disposition: A | Discharge status: Home / Self Care | Payer: BC Managed Care – PPO | Source: Ambulatory Visit | Attending: Obstetrics and Gynecology | Admitting: Obstetrics and Gynecology

## 2022-08-17 DIAGNOSIS — Z1231 Encounter for screening mammogram for malignant neoplasm of breast: Secondary | ICD-10-CM

## 2023-07-04 ENCOUNTER — Other Ambulatory Visit: Payer: Self-pay | Admitting: Obstetrics and Gynecology

## 2023-07-04 DIAGNOSIS — Z1231 Encounter for screening mammogram for malignant neoplasm of breast: Secondary | ICD-10-CM

## 2023-08-19 ENCOUNTER — Ambulatory Visit
Admission: RE | Admit: 2023-08-19 | Discharge: 2023-08-19 | Disposition: A | Discharge status: Home / Self Care | Payer: BC Managed Care – PPO | Source: Ambulatory Visit | Attending: Obstetrics and Gynecology | Admitting: Obstetrics and Gynecology

## 2023-08-19 DIAGNOSIS — Z1231 Encounter for screening mammogram for malignant neoplasm of breast: Secondary | ICD-10-CM

## 2023-08-23 ENCOUNTER — Other Ambulatory Visit: Payer: Self-pay | Admitting: Obstetrics and Gynecology

## 2023-08-23 DIAGNOSIS — R928 Other abnormal and inconclusive findings on diagnostic imaging of breast: Secondary | ICD-10-CM

## 2023-09-03 ENCOUNTER — Ambulatory Visit
Admission: RE | Admit: 2023-09-03 | Discharge: 2023-09-03 | Disposition: A | Discharge status: Home / Self Care | Payer: BC Managed Care – PPO | Source: Ambulatory Visit | Attending: Obstetrics and Gynecology | Admitting: Obstetrics and Gynecology

## 2023-09-03 DIAGNOSIS — R928 Other abnormal and inconclusive findings on diagnostic imaging of breast: Secondary | ICD-10-CM

## 2023-09-07 ENCOUNTER — Other Ambulatory Visit: Payer: BC Managed Care – PPO

## 2023-09-14 ENCOUNTER — Other Ambulatory Visit: Payer: BC Managed Care – PPO

## 2023-10-20 ENCOUNTER — Ambulatory Visit: Payer: 59 | Admitting: Family Medicine

## 2023-10-20 VITALS — BP 126/84 | Ht 61.0 in | Wt 155.0 lb

## 2023-10-20 DIAGNOSIS — M79672 Pain in left foot: Secondary | ICD-10-CM

## 2023-10-20 NOTE — Patient Instructions (Signed)
Your exam and ultrasound are reassuring. 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 Boot or metatarsal pad to unload the area. Time is going to be the main thing that gets this better. Icing 15 minutes at a time if needed. Tylenol, ibuprofen only if needed. Follow up with me in 2 weeks.

## 2023-10-20 NOTE — Progress Notes (Signed)
PCP: Chilton Greathouse, MD  Subjective:   HPI: Patient is a 63 y.o. female here for left foot pain. Acute onset last night with no triggering event or injury. Patient states that she noticed significant sharp pain to the base of the first and second toe when bearing weight and walking. Has mild numbness to the area after long periods of bearing weight. Prior stress injury to the dorsal aspect of the foot a couple years ago, but no other injuries since then. Denies tingling or weakness of distal toes.   No past medical history on file.  Current Outpatient Medications on File Prior to Visit  Medication Sig Dispense Refill   ALPRAZolam (XANAX) 0.25 MG tablet TAKE 1 TABLET ONCE DAILY AS NEEDED  1   buPROPion (WELLBUTRIN SR) 150 MG 12 hr tablet TAKE 1 TABLET BY MOUTH EVERY DAY IN THE MORNING  6   estradiol (VIVELLE-DOT) 0.05 MG/24HR patch APPLY 1 PATCH TWICE A WEEK     levothyroxine (SYNTHROID, LEVOTHROID) 100 MCG tablet Take 100 mcg by mouth daily.  1   meloxicam (MOBIC) 15 MG tablet TAKE 1 TABLET BY MOUTH EVERY DAY AS NEEDED FOR PAIN WITH FOOD. 40 tablet 0   phentermine (ADIPEX-P) 37.5 MG tablet Take 37.5 mg by mouth every morning.  0   progesterone (PROMETRIUM) 100 MG capsule Take 200 mg by mouth at bedtime.  3   rosuvastatin (CRESTOR) 10 MG tablet Take 10 mg by mouth daily.  3   No current facility-administered medications on file prior to visit.    No past surgical history on file.  No Known Allergies  BP 126/84   Ht 5\' 1"  (1.549 m)   Wt 155 lb (70.3 kg)   BMI 29.29 kg/m      08/14/2020    8:25 AM 09/04/2020    8:32 AM  [facility]  Frequency of aerobic exercise (# of days/week) 5 5  Average time in minutes 45 45  Frequency of strengthening activities (# of days/week) 0 0        No data to display              Objective:  Physical Exam:  Gen: NAD, comfortable in exam room Left foot: No gross deformities, discoloration, or edema. Mild TTP  between the distal heads of the 1st and 2nd metatarsals. Full ROM of the distal toes without pain. Pain with full plantar flexion. NV intact.   Limited MSK u/s left foot:  no cortical irregularity of 1st-3rd metatarsals.  Extensor and flexor tendons intact.  Sesamoids appear normal.  Assessment & Plan:  1. Left foot pain Acute onset of localized pain between the first and second left distal metatarsals. Low suspicion for acute injury such as fracture or dislocation. Suspect likely digital neuropathy at the metatarsal heads. Too acute of an onset to suggest morton's neuroma. Plan for activity modification, short boot vs metatarsal pad to alleviate the weight off of the area. Follow-up in 2 weeks or sooner if pain does not improve or worsen.   Icing, tylenol or ibuprofen if needed.  Nechama Guard, MS4 [facility] [employer]

## 2023-11-03 ENCOUNTER — Ambulatory Visit: Payer: 59 | Admitting: Family Medicine

## 2024-03-06 ENCOUNTER — Other Ambulatory Visit: Payer: Self-pay | Admitting: Obstetrics and Gynecology

## 2024-03-06 DIAGNOSIS — Z1231 Encounter for screening mammogram for malignant neoplasm of breast: Secondary | ICD-10-CM

## 2024-09-24 ENCOUNTER — Ambulatory Visit
Admission: RE | Admit: 2024-09-24 | Discharge: 2024-09-24 | Disposition: A | Discharge status: Home / Self Care | Source: Ambulatory Visit | Attending: Obstetrics and Gynecology | Admitting: Obstetrics and Gynecology

## 2024-09-24 DIAGNOSIS — Z1231 Encounter for screening mammogram for malignant neoplasm of breast: Secondary | ICD-10-CM
